# Patient Record
Sex: Female | Born: 1960 | Race: Black or African American | Hispanic: No | Marital: Single | Smoking: Never smoker
Health system: Southern US, Community
[De-identification: ages and names within clinical notes are randomized; demographics above are authoritative.]

## PROBLEM LIST (undated history)

## (undated) HISTORY — PX: EYE SURGERY: SHX253

---

## 2000-05-26 ENCOUNTER — Emergency Department (HOSPITAL_COMMUNITY): Admission: EM | Admit: 2000-05-26 | Discharge: 2000-05-26 | Payer: Self-pay | Admitting: Emergency Medicine

## 2001-01-26 ENCOUNTER — Emergency Department (HOSPITAL_COMMUNITY): Admission: EM | Admit: 2001-01-26 | Discharge: 2001-01-26 | Payer: Self-pay | Admitting: *Deleted

## 2003-05-13 ENCOUNTER — Ambulatory Visit (HOSPITAL_COMMUNITY): Admission: RE | Admit: 2003-05-13 | Discharge: 2003-05-13 | Payer: Self-pay | Admitting: Family Medicine

## 2003-05-23 ENCOUNTER — Other Ambulatory Visit: Admission: RE | Admit: 2003-05-23 | Discharge: 2003-05-23 | Payer: Self-pay | Admitting: Family Medicine

## 2003-08-22 ENCOUNTER — Emergency Department (HOSPITAL_COMMUNITY): Admission: EM | Admit: 2003-08-22 | Discharge: 2003-08-22 | Payer: Self-pay | Admitting: Emergency Medicine

## 2003-11-28 ENCOUNTER — Emergency Department (HOSPITAL_COMMUNITY): Admission: EM | Admit: 2003-11-28 | Discharge: 2003-11-28 | Payer: Self-pay | Admitting: Emergency Medicine

## 2004-06-23 ENCOUNTER — Ambulatory Visit (HOSPITAL_COMMUNITY): Admission: RE | Admit: 2004-06-23 | Discharge: 2004-06-23 | Payer: Self-pay | Admitting: Obstetrics & Gynecology

## 2004-07-13 ENCOUNTER — Ambulatory Visit (HOSPITAL_COMMUNITY): Admission: RE | Admit: 2004-07-13 | Discharge: 2004-07-13 | Payer: Self-pay | Admitting: Obstetrics & Gynecology

## 2006-03-12 ENCOUNTER — Emergency Department (HOSPITAL_COMMUNITY): Admission: EM | Admit: 2006-03-12 | Discharge: 2006-03-12 | Payer: Self-pay | Admitting: Emergency Medicine

## 2010-12-17 ENCOUNTER — Ambulatory Visit (INDEPENDENT_AMBULATORY_CARE_PROVIDER_SITE_OTHER): Payer: Self-pay

## 2010-12-17 ENCOUNTER — Inpatient Hospital Stay (INDEPENDENT_AMBULATORY_CARE_PROVIDER_SITE_OTHER)
Admission: RE | Admit: 2010-12-17 | Discharge: 2010-12-17 | Disposition: A | Discharge status: Home / Self Care | Payer: Self-pay | Source: Ambulatory Visit | Attending: Family Medicine | Admitting: Family Medicine

## 2010-12-17 DIAGNOSIS — J40 Bronchitis, not specified as acute or chronic: Secondary | ICD-10-CM

## 2012-08-15 ENCOUNTER — Emergency Department (HOSPITAL_COMMUNITY): Payer: Self-pay

## 2012-08-15 ENCOUNTER — Other Ambulatory Visit: Payer: Self-pay

## 2012-08-15 ENCOUNTER — Emergency Department (HOSPITAL_COMMUNITY)
Admission: EM | Admit: 2012-08-15 | Discharge: 2012-08-15 | Disposition: A | Discharge status: Home / Self Care | Payer: Self-pay | Attending: Emergency Medicine | Admitting: Emergency Medicine

## 2012-08-15 ENCOUNTER — Encounter (HOSPITAL_COMMUNITY): Payer: Self-pay | Admitting: Emergency Medicine

## 2012-08-15 DIAGNOSIS — J019 Acute sinusitis, unspecified: Secondary | ICD-10-CM | POA: Insufficient documentation

## 2012-08-15 DIAGNOSIS — R059 Cough, unspecified: Secondary | ICD-10-CM | POA: Insufficient documentation

## 2012-08-15 DIAGNOSIS — R51 Headache: Secondary | ICD-10-CM | POA: Insufficient documentation

## 2012-08-15 DIAGNOSIS — R05 Cough: Secondary | ICD-10-CM | POA: Insufficient documentation

## 2012-08-15 LAB — CBC WITH DIFFERENTIAL/PLATELET
Basophils Absolute: 0 10*3/uL (ref 0.0–0.1)
Basophils Relative: 0 % (ref 0–1)
Eosinophils Absolute: 0 10*3/uL (ref 0.0–0.7)
Hemoglobin: 13.1 g/dL (ref 12.0–15.0)
MCH: 28.1 pg (ref 26.0–34.0)
MCHC: 34 g/dL (ref 30.0–36.0)
Monocytes Absolute: 0.5 10*3/uL (ref 0.1–1.0)
Monocytes Relative: 9 % (ref 3–12)
Neutrophils Relative %: 35 % — ABNORMAL LOW (ref 43–77)
RDW: 12.9 % (ref 11.5–15.5)

## 2012-08-15 LAB — BASIC METABOLIC PANEL
BUN: 12 mg/dL (ref 6–23)
Calcium: 9.6 mg/dL (ref 8.4–10.5)
Creatinine, Ser: 0.7 mg/dL (ref 0.50–1.10)
GFR calc non Af Amer: >90 mL/min (ref >90)
Glucose, Bld: 83 mg/dL (ref 70–99)
Potassium: 4.3 mEq/L (ref 3.5–5.1)

## 2012-08-15 LAB — POCT I-STAT TROPONIN I

## 2012-08-15 MED ORDER — AMOXICILLIN 500 MG PO CAPS
500.0000 mg | ORAL_CAPSULE | Freq: Once | ORAL | Status: AC
  Administered 2012-08-15: 500 mg via ORAL
  Filled 2012-08-15: qty 1

## 2012-08-15 MED ORDER — AMOXICILLIN 500 MG PO CAPS: 500.0000 mg | ORAL_CAPSULE | Freq: Three times a day (TID) | ORAL | Status: AC

## 2012-08-15 NOTE — ED Notes (Signed)
Discharge instructions reviewed. Pt verbalized understanding.  

## 2012-08-15 NOTE — ED Notes (Signed)
Patient claimed she had "chest pain"

## 2012-08-15 NOTE — ED Notes (Signed)
Pt states that she has congestion, pain with breathing, and a cough. Pt states it gets worse at night. Pt states she has a headache and low back pain. Pt states she has had her symptoms for a week now and nothing is making her feel better.

## 2012-08-15 NOTE — ED Notes (Signed)
Pt c/o congestion and "chronic bronchitis". Pt states she has had congestion, sore throat, and dizziness x 1wk. Pt states she is having difficulty swallowing and rt and left shoulder and chest pain. Pt also states her blood pressure is "high at night". Pt has not taken any medications for sx.

## 2012-08-16 NOTE — Progress Notes (Signed)
Patient was seen yesterday, 08/15/12, in ED with c/o cough and congestion. Was discharged with prescription for amoxicillin. Returned today requesting assistance with filling medications. CM out to waiting room to speak with patient. Reported not having a job or medication insurance. Patient elligible for [facility] program. Explained MATCH assistance to patient, including $3 co pay per med and enrollment only once a rolling calendar year. Patient voiced understanding. Enrolled in program. Letter given to patient. CM also asked patient for permission to contact Sunny Schlein, [facility] liaisons to assist her with locating a PCP. Verbalized consent and verified demographic information on chart. Felicia to contact patient this afternoon. No further needs identified.

## 2012-08-22 NOTE — ED Provider Notes (Signed)
History     CSN: [id number]  Arrival date & time 08/15/12  1552   None     Chief Complaint  Patient presents with  . URI    Upper resp infection    (Consider location/radiation/quality/duration/timing/severity/associated sxs/prior treatment) Patient is a 52 y.o. female presenting with URI. The history is provided by the patient.  URI Presenting symptoms: congestion, cough, facial pain and rhinorrhea   Presenting symptoms: no fever   Severity:  Moderate Onset quality:  Gradual Duration:  4 days Timing:  Constant Progression:  Unchanged Chronicity:  New Relieved by:  Nothing Worsened by:  Nothing tried   History reviewed. No pertinent past medical history.  Past Surgical History  Procedure Laterality Date  . Eye surgery      No family history on file.  History  Substance Use Topics  . Smoking status: Never Smoker   . Smokeless tobacco: Not on file  . Alcohol Use: No    OB History   Grav Para Term Preterm Abortions TAB SAB Ect Mult Living                  Review of Systems  Constitutional: Negative for fever and chills.  HENT: Positive for congestion and rhinorrhea.   Eyes: Negative.   Respiratory: Positive for cough.   Cardiovascular: Negative.   Gastrointestinal: Negative.   Genitourinary: Negative.   Musculoskeletal: Negative.   Skin: Negative.   Neurological: Negative.   Psychiatric/Behavioral: Negative.     Allergies  Review of patient's allergies indicates no known allergies.  Home Medications   Current Outpatient Rx  Name  Route  Sig  Dispense  Refill  . amoxicillin (AMOXIL) 500 MG capsule   Oral   Take 1 capsule (500 mg total) by mouth 3 (three) times daily.   30 capsule   0     BP 115/62  Pulse 65  Temp(Src) 98.2 F (36.8 C) (Oral)  Resp 20  Ht 5' 2.5" (1.588 m)  Wt 155 lb (70.308 kg)  BMI 27.88 kg/m2  SpO2 99%  Physical Exam  Nursing note and vitals reviewed. Constitutional: She is oriented to person, place, and time.  She appears well-developed and well-nourished. No distress.  HENT:  Head: Normocephalic and atraumatic.  Right Ear: External ear normal.  Mouth/Throat: Oropharynx is clear and moist.  Nasal turbinates swollen and red; clear rhinorrhea.  Eyes: Conjunctivae and EOM are normal. Pupils are equal, round, and reactive to light.  Neck: Normal range of motion. Neck supple.  Cardiovascular: Normal rate, regular rhythm and normal heart sounds.   Pulmonary/Chest: Effort normal and breath sounds normal.  Abdominal: Soft. Bowel sounds are normal.  Musculoskeletal: Normal range of motion.  Neurological: She is oriented to person, place, and time.  No sensory or motor deficit.  Skin: Skin is warm and dry.  Psychiatric: She has a normal mood and affect. Her behavior is normal.    ED Course  Procedures (including critical care time)  Results for orders placed during the hospital encounter of 08/15/12  BASIC METABOLIC PANEL      Result Value Range   Sodium 139  135 - 145 mEq/L   Potassium 4.3  3.5 - 5.1 mEq/L   Chloride 103  96 - 112 mEq/L   CO2 26  19 - 32 mEq/L   Glucose, Bld 83  70 - 99 mg/dL   BUN 12  6 - 23 mg/dL   Creatinine, Ser 4.09  0.50 - 1.10 mg/dL   Calcium  9.6  8.4 - 10.5 mg/dL   GFR calc non Af Amer >90  >90 mL/min   GFR calc Af Amer >90  >90 mL/min  CBC WITH DIFFERENTIAL      Result Value Range   WBC 5.2  4.0 - 10.5 K/uL   RBC 4.66  3.87 - 5.11 MIL/uL   Hemoglobin 13.1  12.0 - 15.0 g/dL   HCT 16.1  09.6 - 04.5 %   MCV 82.6  78.0 - 100.0 fL   MCH 28.1  26.0 - 34.0 pg   MCHC 34.0  30.0 - 36.0 g/dL   RDW 40.9  81.1 - 91.4 %   Platelets 327  150 - 400 K/uL   Neutrophils Relative % 35 (*) 43 - 77 %   Neutro Abs 1.8  1.7 - 7.7 K/uL   Lymphocytes Relative 55 (*) 12 - 46 %   Lymphs Abs 2.8  0.7 - 4.0 K/uL   Monocytes Relative 9  3 - 12 %   Monocytes Absolute 0.5  0.1 - 1.0 K/uL   Eosinophils Relative 1  0 - 5 %   Eosinophils Absolute 0.0  0.0 - 0.7 K/uL   Basophils Relative  0  0 - 1 %   Basophils Absolute 0.0  0.0 - 0.1 K/uL  POCT I-STAT TROPONIN I      Result Value Range   Troponin i, poc 0.00  0.00 - 0.08 ng/mL   Comment 3            Dg Chest 2 View  08/15/2012   *RADIOLOGY REPORT*  Clinical Data: Chest pain, shortness of breath, cough, congestion  CHEST - 2 VIEW  Comparison: 92,012  Findings: Marked dextroconvex thoracolumbar scoliosis. Normal heart size, mediastinal contours and pulmonary vascularity. Lungs clear. No pleural effusion or pneumothorax. Bones appear diffusely demineralized.  IMPRESSION: Significant dextroconvex thoracolumbar scoliosis. No acute abnormalities.   Original Report Authenticated By: Ulyses Southward, M.D.    Rx with amoxicillin for sinusitis.    1. Acute sinusitis          Carleene Cooper III, MD 08/22/12 [phone]

## 2012-10-09 ENCOUNTER — Other Ambulatory Visit (HOSPITAL_COMMUNITY): Payer: Self-pay | Admitting: Internal Medicine

## 2012-10-09 DIAGNOSIS — Z1231 Encounter for screening mammogram for malignant neoplasm of breast: Secondary | ICD-10-CM

## 2012-10-09 DIAGNOSIS — R102 Pelvic and perineal pain: Secondary | ICD-10-CM

## 2012-10-09 DIAGNOSIS — R52 Pain, unspecified: Secondary | ICD-10-CM

## 2012-10-16 ENCOUNTER — Ambulatory Visit (HOSPITAL_COMMUNITY)
Admission: RE | Admit: 2012-10-16 | Discharge: 2012-10-16 | Disposition: A | Discharge status: Home / Self Care | Payer: No Typology Code available for payment source | Source: Ambulatory Visit | Attending: Internal Medicine | Admitting: Internal Medicine

## 2012-10-16 DIAGNOSIS — R52 Pain, unspecified: Secondary | ICD-10-CM

## 2012-10-16 DIAGNOSIS — R102 Pelvic and perineal pain: Secondary | ICD-10-CM

## 2012-10-16 DIAGNOSIS — N949 Unspecified condition associated with female genital organs and menstrual cycle: Secondary | ICD-10-CM | POA: Insufficient documentation

## 2012-10-23 ENCOUNTER — Ambulatory Visit (HOSPITAL_COMMUNITY)
Admission: RE | Admit: 2012-10-23 | Discharge: 2012-10-23 | Disposition: A | Discharge status: Home / Self Care | Payer: No Typology Code available for payment source | Source: Ambulatory Visit | Attending: Internal Medicine | Admitting: Internal Medicine

## 2012-10-23 DIAGNOSIS — Z1231 Encounter for screening mammogram for malignant neoplasm of breast: Secondary | ICD-10-CM | POA: Insufficient documentation

## 2013-09-24 ENCOUNTER — Other Ambulatory Visit (HOSPITAL_COMMUNITY): Payer: Self-pay | Admitting: Internal Medicine

## 2013-09-24 DIAGNOSIS — Z1231 Encounter for screening mammogram for malignant neoplasm of breast: Secondary | ICD-10-CM

## 2013-09-30 ENCOUNTER — Ambulatory Visit (HOSPITAL_COMMUNITY)
Admission: RE | Admit: 2013-09-30 | Discharge: 2013-09-30 | Disposition: A | Discharge status: Home / Self Care | Payer: No Typology Code available for payment source | Source: Ambulatory Visit | Attending: Internal Medicine | Admitting: Internal Medicine

## 2013-09-30 ENCOUNTER — Other Ambulatory Visit (HOSPITAL_COMMUNITY): Payer: Self-pay | Admitting: Internal Medicine

## 2013-09-30 DIAGNOSIS — R0789 Other chest pain: Secondary | ICD-10-CM | POA: Insufficient documentation

## 2013-09-30 DIAGNOSIS — R05 Cough: Secondary | ICD-10-CM

## 2013-09-30 DIAGNOSIS — R059 Cough, unspecified: Secondary | ICD-10-CM | POA: Insufficient documentation

## 2013-09-30 DIAGNOSIS — M4120 Other idiopathic scoliosis, site unspecified: Secondary | ICD-10-CM | POA: Insufficient documentation

## 2013-10-27 ENCOUNTER — Ambulatory Visit (HOSPITAL_COMMUNITY)
Admission: RE | Admit: 2013-10-27 | Discharge: 2013-10-27 | Disposition: A | Discharge status: Home / Self Care | Payer: No Typology Code available for payment source | Source: Ambulatory Visit | Attending: Internal Medicine | Admitting: Internal Medicine

## 2013-10-27 DIAGNOSIS — Z1231 Encounter for screening mammogram for malignant neoplasm of breast: Secondary | ICD-10-CM | POA: Insufficient documentation

## 2013-11-10 ENCOUNTER — Other Ambulatory Visit (HOSPITAL_COMMUNITY): Payer: Self-pay | Admitting: Internal Medicine

## 2013-11-10 ENCOUNTER — Ambulatory Visit (HOSPITAL_COMMUNITY)
Admission: RE | Admit: 2013-11-10 | Discharge: 2013-11-10 | Disposition: A | Discharge status: Home / Self Care | Payer: No Typology Code available for payment source | Source: Ambulatory Visit | Attending: Internal Medicine | Admitting: Internal Medicine

## 2013-11-10 DIAGNOSIS — M25559 Pain in unspecified hip: Secondary | ICD-10-CM | POA: Insufficient documentation

## 2013-11-10 DIAGNOSIS — M1990 Unspecified osteoarthritis, unspecified site: Secondary | ICD-10-CM

## 2013-11-10 DIAGNOSIS — E559 Vitamin D deficiency, unspecified: Secondary | ICD-10-CM

## 2013-11-10 DIAGNOSIS — M25519 Pain in unspecified shoulder: Secondary | ICD-10-CM | POA: Insufficient documentation

## 2014-09-17 IMAGING — US US PELVIS COMPLETE
1 series · 14 of 25 positions shown · non-contrast
Comparison: 06/23/2004

CLINICAL DATA: Bilateral pelvic pain.

TRANSABDOMINAL AND TRANSVAGINAL ULTRASOUND OF PELVIS
TECHNIQUE: Both transabdominal and transvaginal ultrasound
examinations of the pelvis were performed including evaluation of
the uterus, ovaries, adnexal regions, and pelvic cul-de-sac.

[Series 1: us pelvis complete · 0.28mm/px · 47 acquisitions, 14 frames shown]
[im 1/47]
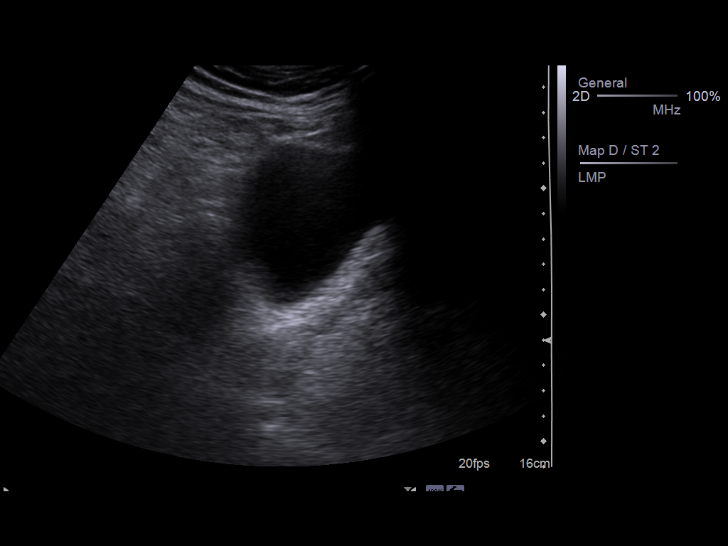
[im 4/47]
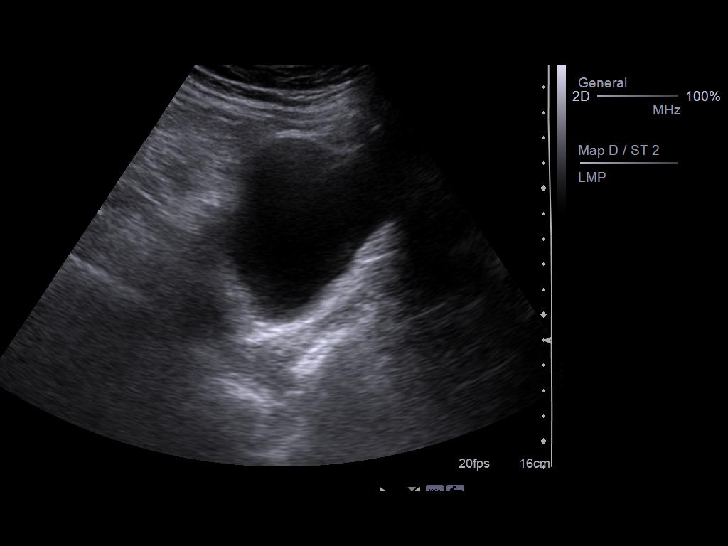
[im 8/47]
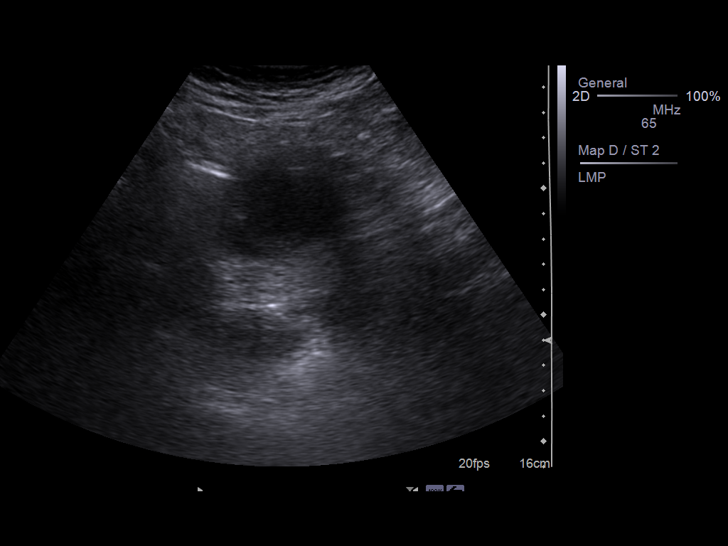
[im 12/47]
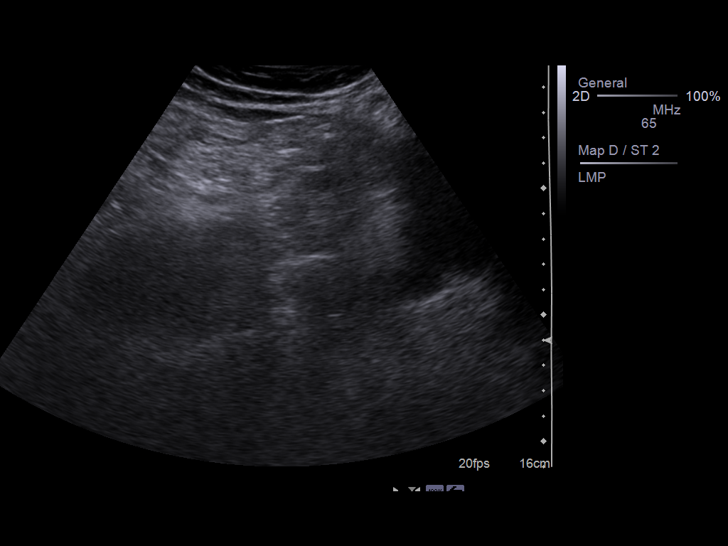
[im 16/47]
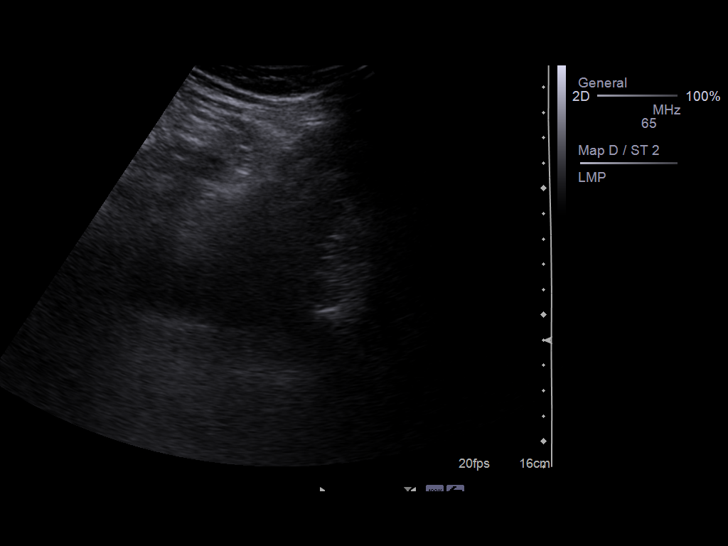
[im 18/47]
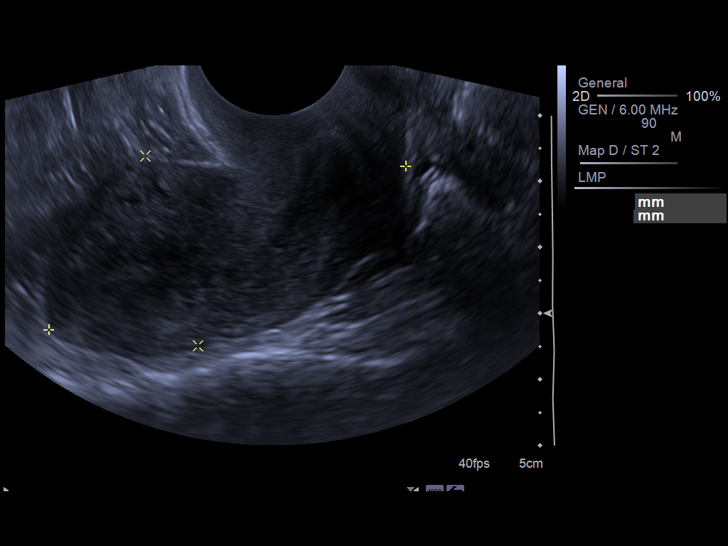
[im 22/47]
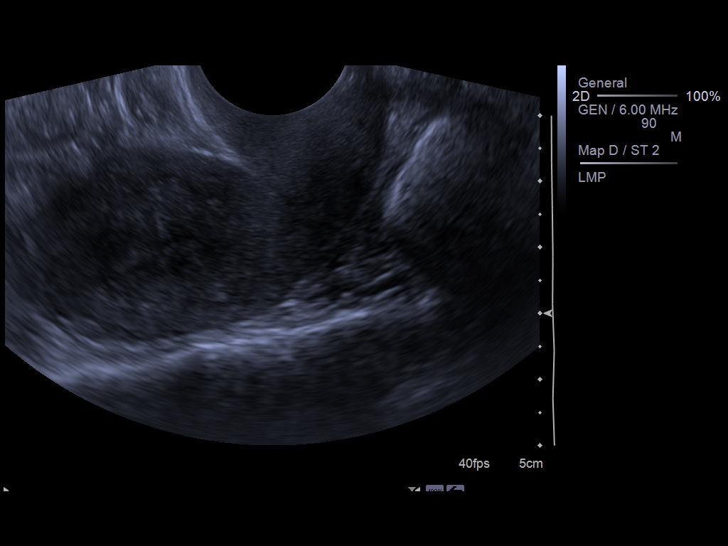
[im 25/47]
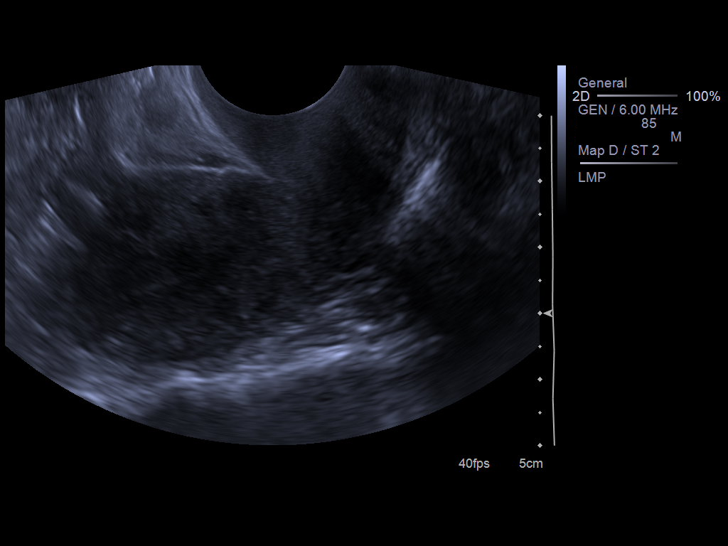
[im 29/47]
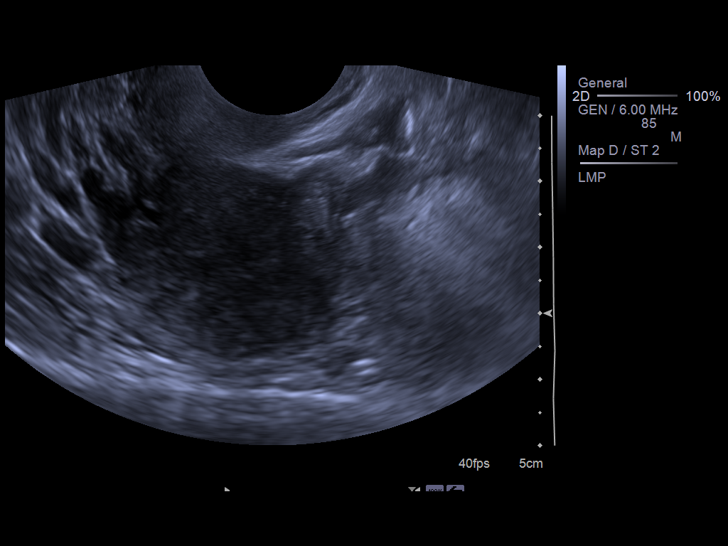
[im 31/47]
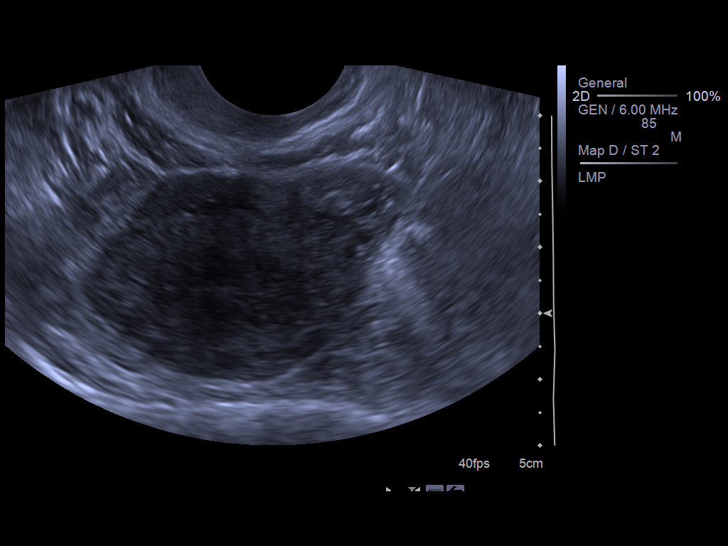
[im 35/47]
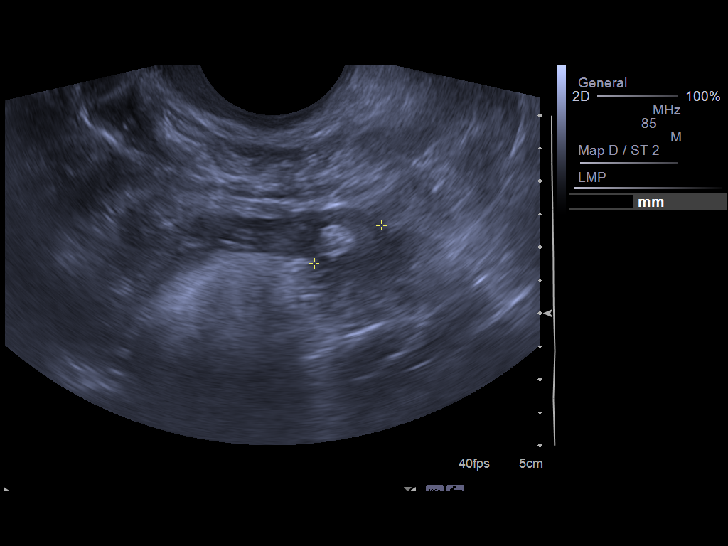
[im 39/47]
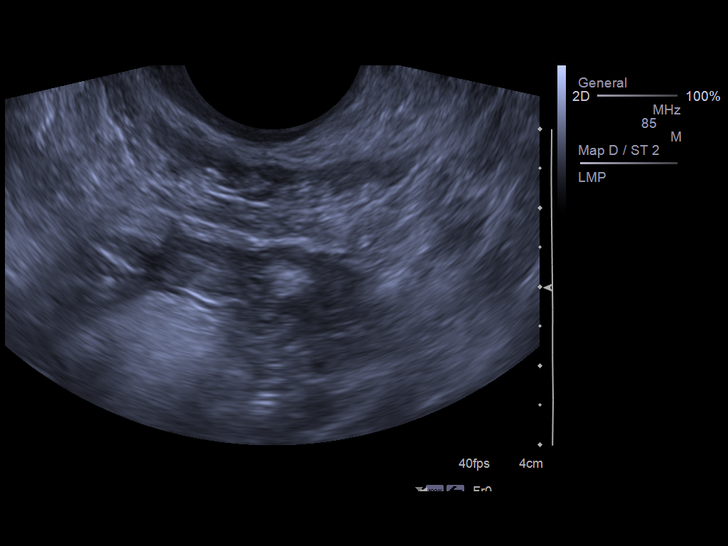
[im 43/47]
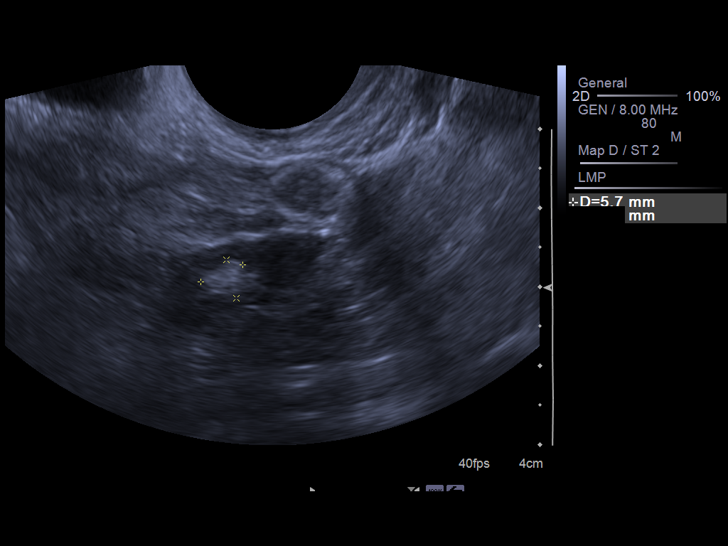
[im 47/47]
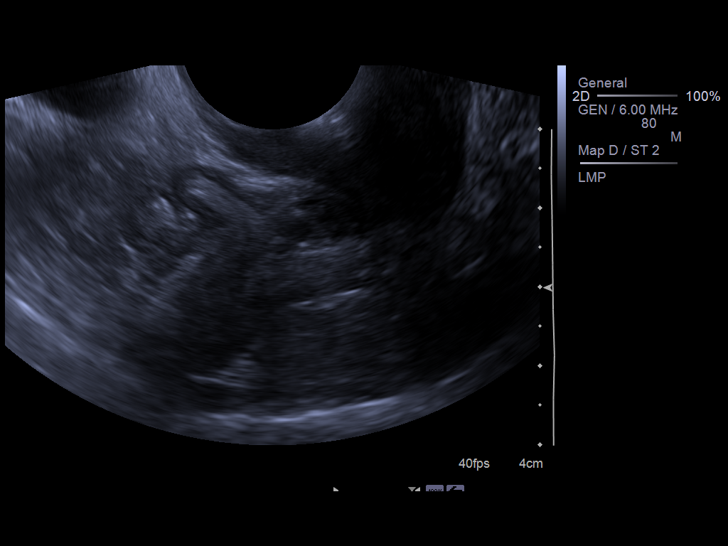

[14 of 25 positions shown; findings below may reference images not displayed]

FINDINGS: Uterus: 5.9 x 3.0 x 3.8 cm.  Normal echotexture.  No focal
abnormality.

Endometrium: Normal appearance and thickness, 4 mm.

Right Ovary: Not visualized.  No adnexal mass.

Left Ovary: 2.1 x 1.8 x 1.2 cm.  6 mm hyperechoic focus within the
left ovary.  This was seen in 8002 and is stable.  Stability is
compatible with a benign process.

Other Findings:  No free fluid.
IMPRESSION: No acute findings.

## 2015-04-07 ENCOUNTER — Other Ambulatory Visit: Payer: Self-pay | Admitting: Internal Medicine

## 2015-04-07 DIAGNOSIS — Z1231 Encounter for screening mammogram for malignant neoplasm of breast: Secondary | ICD-10-CM

## 2015-04-14 ENCOUNTER — Ambulatory Visit
Admission: RE | Admit: 2015-04-14 | Discharge: 2015-04-14 | Disposition: A | Discharge status: Home / Self Care | Payer: No Typology Code available for payment source | Source: Ambulatory Visit | Attending: Internal Medicine | Admitting: Internal Medicine

## 2015-04-14 DIAGNOSIS — Z1231 Encounter for screening mammogram for malignant neoplasm of breast: Secondary | ICD-10-CM

## 2015-10-12 IMAGING — CR DG SHOULDER 2+V*L*
4 series · 4 of 4 positions shown · non-contrast
Comparison: None.

CLINICAL DATA: Posterior shoulder pain, history of osteoarthritis

EXAM:
LEFT SHOULDER - 2+ VIEW

[view not recorded (1 of 4)]
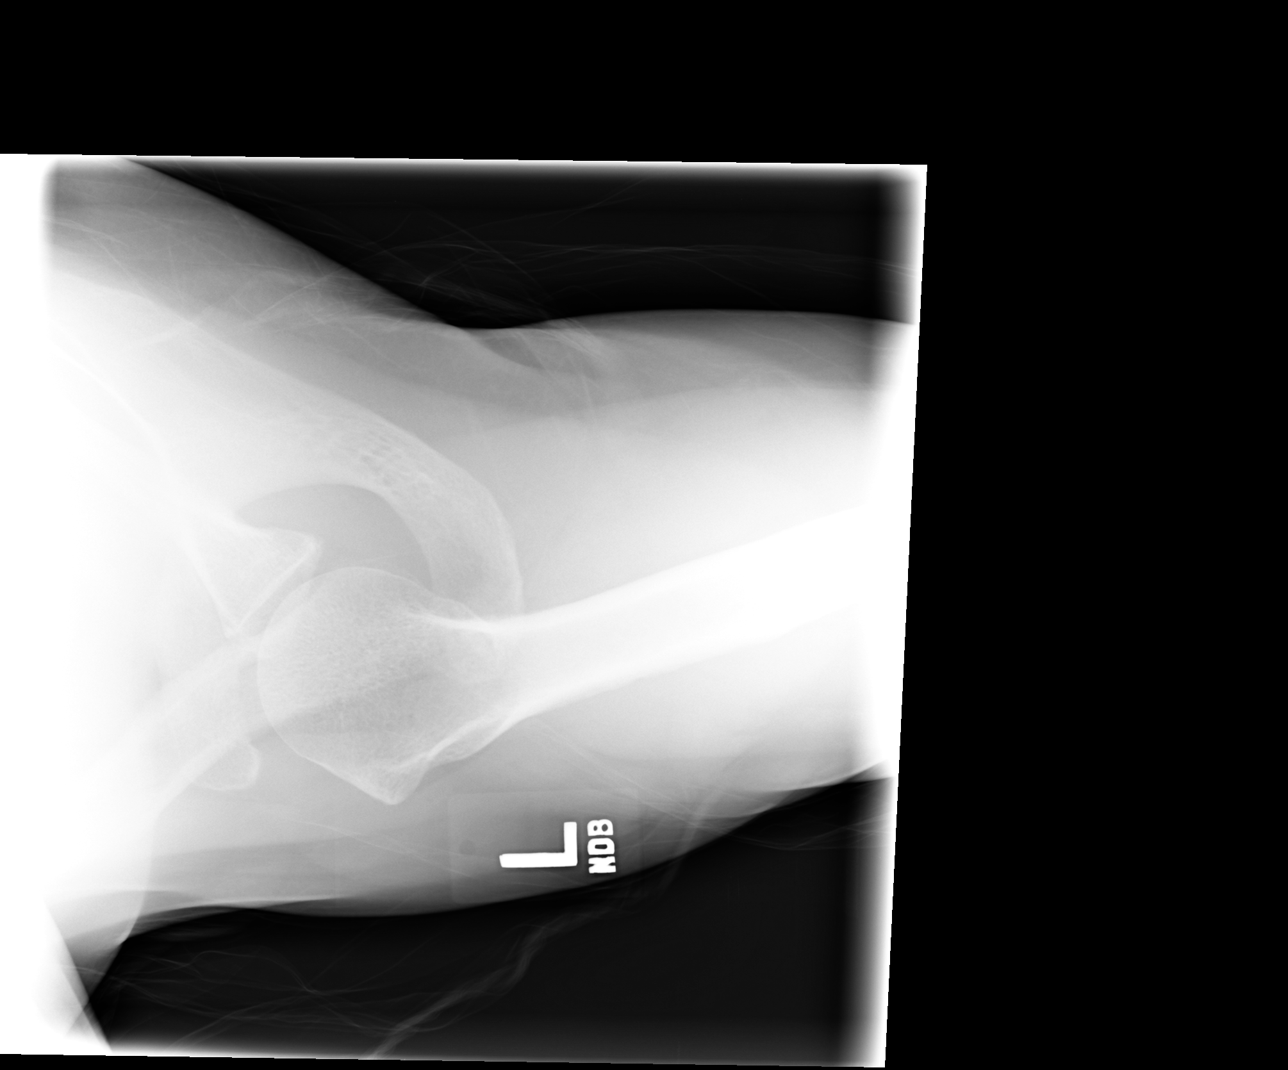

[view not recorded (2 of 4)]
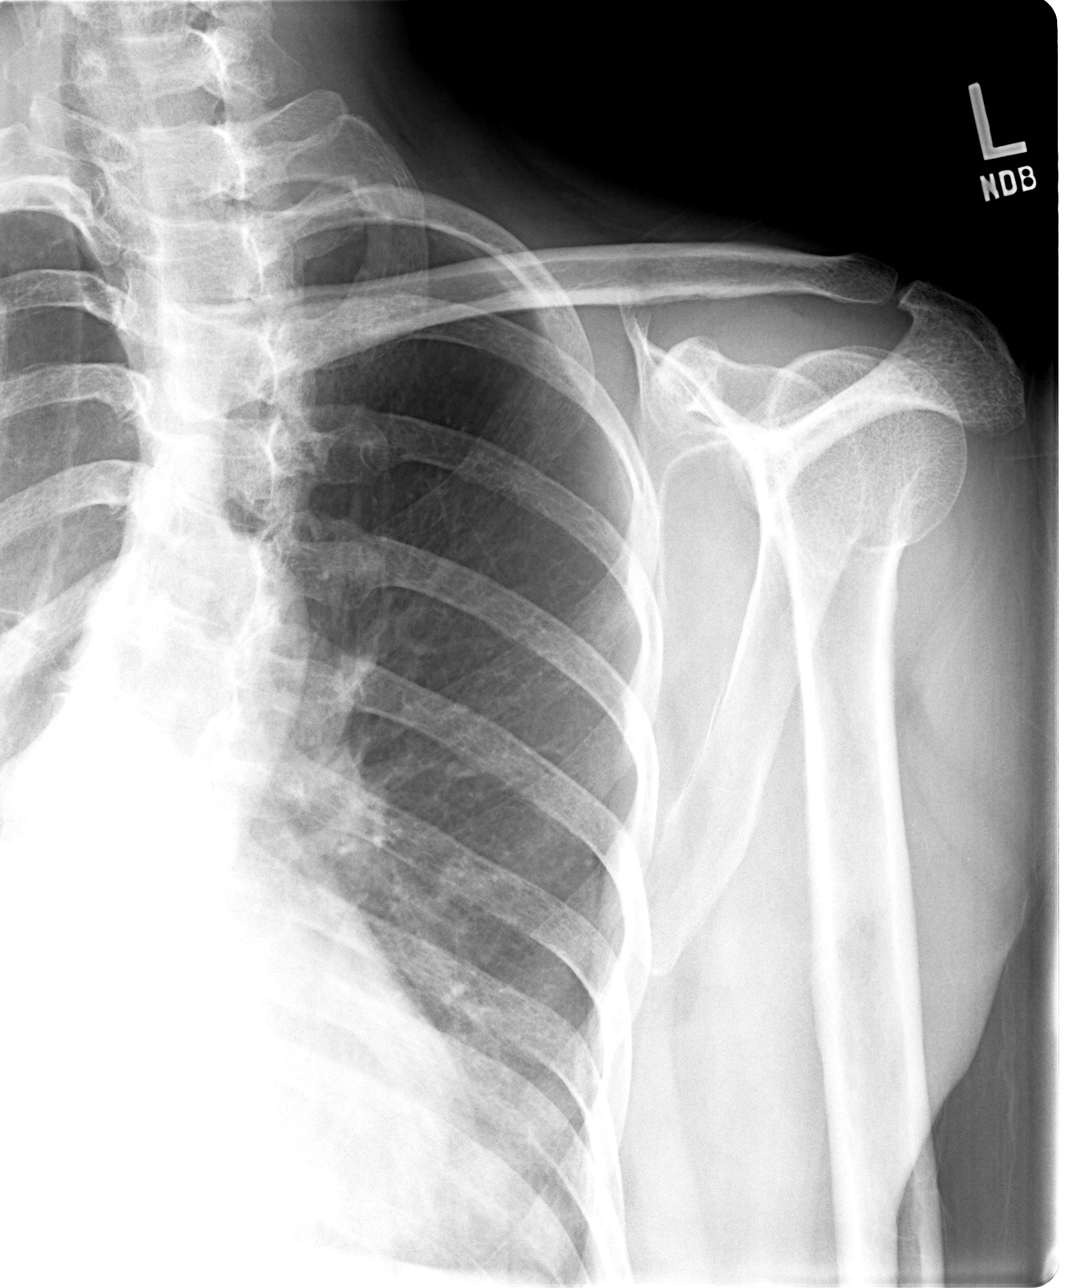

[view not recorded (3 of 4)]
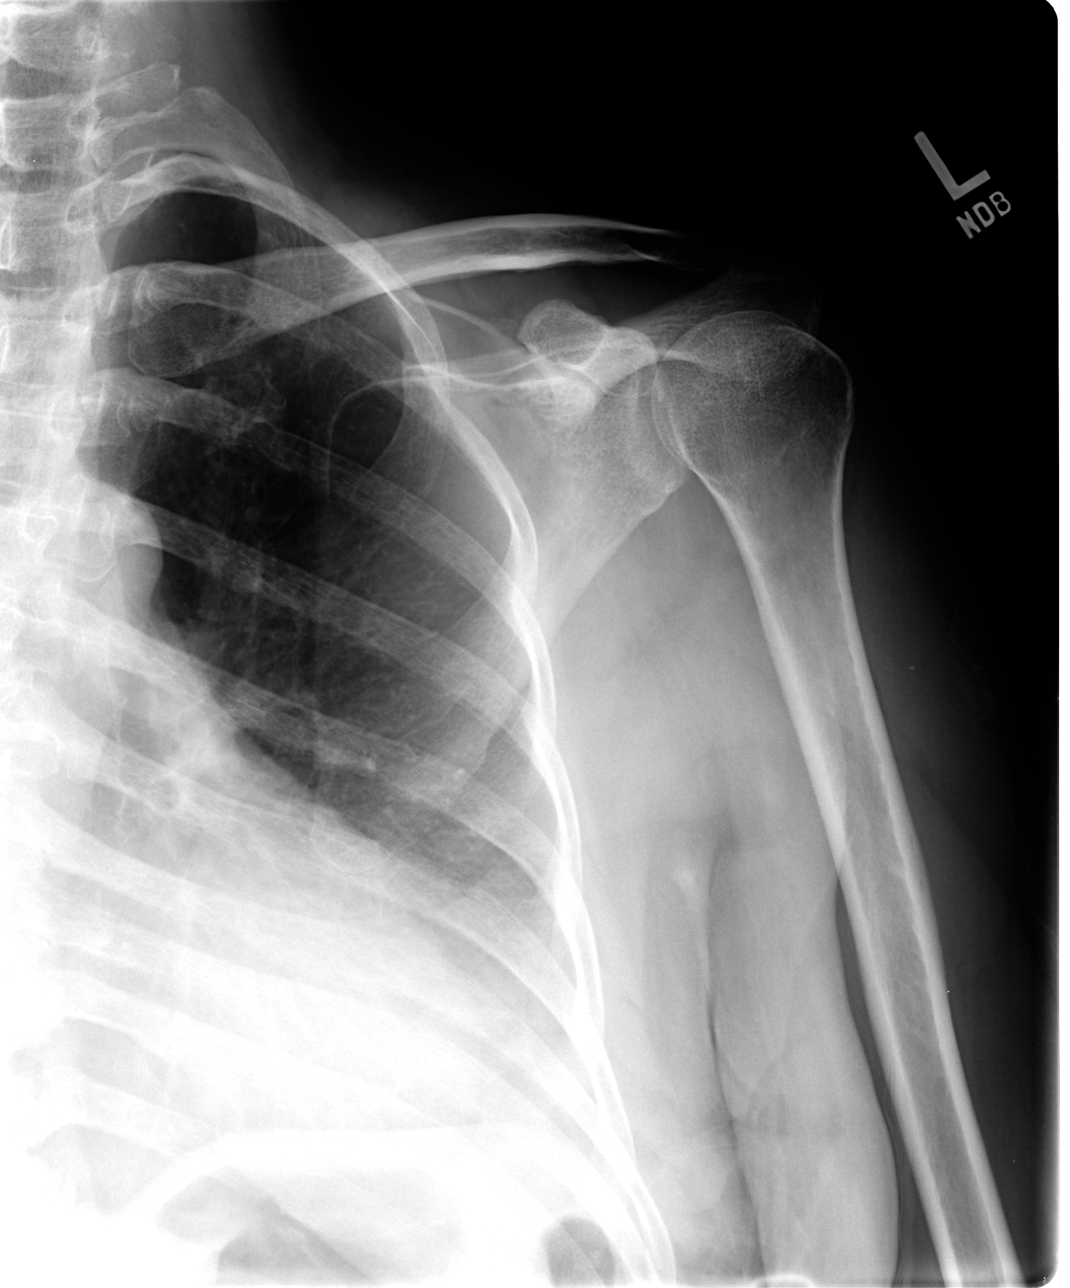

[view not recorded (4 of 4)]
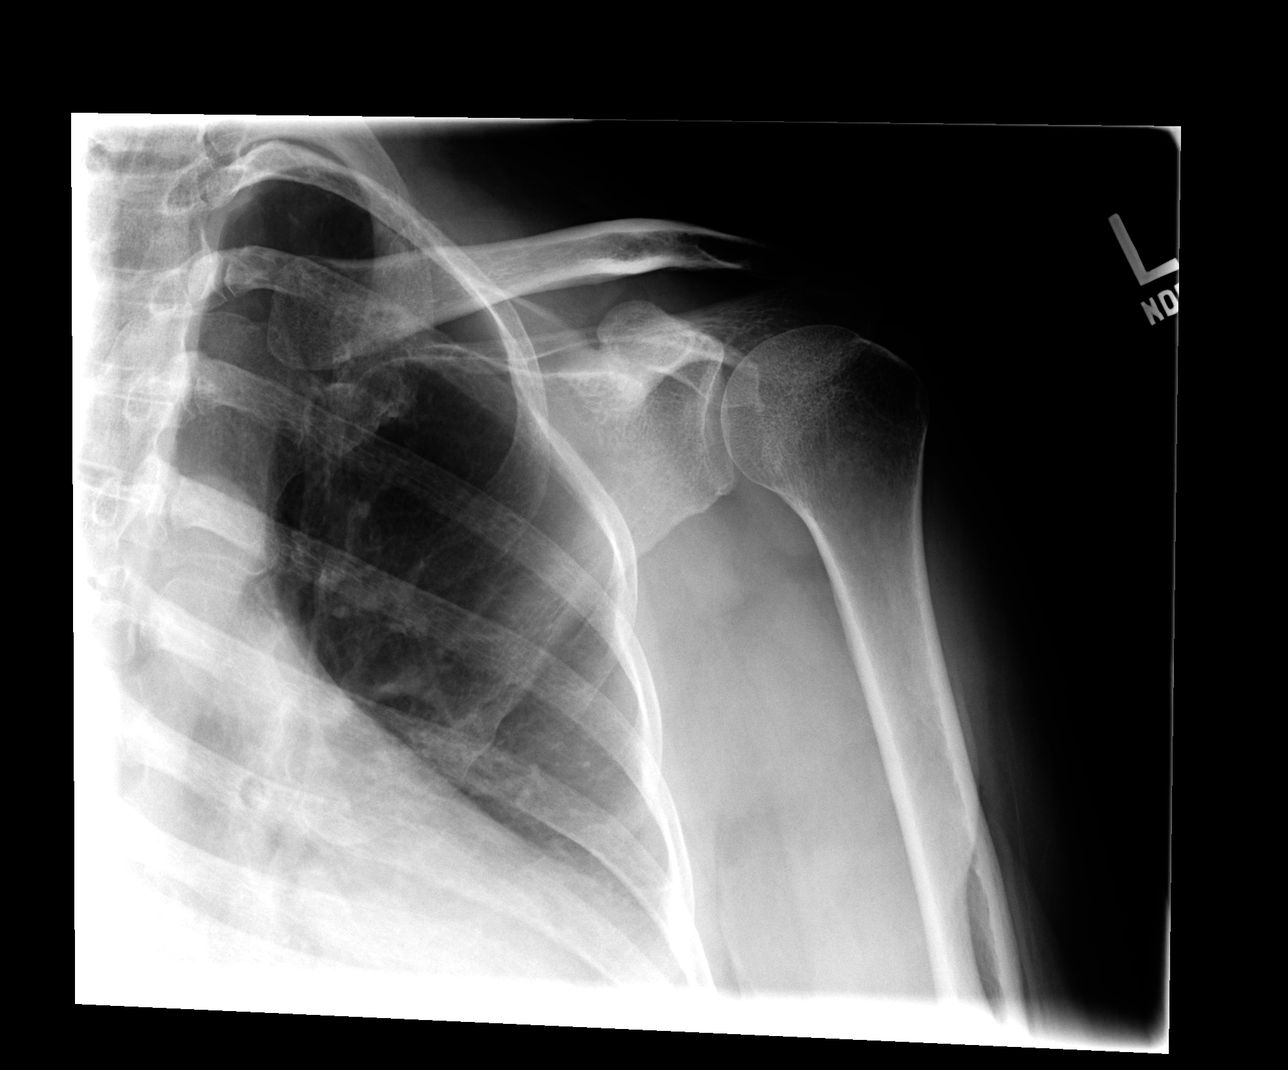

[4 of 4 positions shown; findings below may reference images not displayed]

FINDINGS: No fracture or dislocation is seen.

The joint spaces are preserved.

The visualized soft tissues are unremarkable.

Visualized left lung is clear.
IMPRESSION: No acute osseus abnormality is seen.

## 2020-12-22 ENCOUNTER — Encounter: Payer: No Typology Code available for payment source | Admitting: Obstetrics & Gynecology

## 2022-01-03 ENCOUNTER — Encounter (HOSPITAL_COMMUNITY): Payer: Self-pay | Admitting: *Deleted

## 2022-01-03 ENCOUNTER — Ambulatory Visit (HOSPITAL_COMMUNITY)
Admission: EM | Admit: 2022-01-03 | Discharge: 2022-01-03 | Disposition: A | Discharge status: Home / Self Care | Payer: No Typology Code available for payment source | Attending: Physician Assistant | Admitting: Physician Assistant

## 2022-01-03 DIAGNOSIS — M545 Low back pain, unspecified: Secondary | ICD-10-CM

## 2022-01-03 DIAGNOSIS — M62830 Muscle spasm of back: Secondary | ICD-10-CM

## 2022-01-03 MED ORDER — TIZANIDINE HCL 4 MG PO TABS: 4.0000 mg | ORAL_TABLET | Freq: Four times a day (QID) | ORAL | 0 refills | Status: AC | PRN

## 2022-01-03 MED ORDER — IBUPROFEN 600 MG PO TABS: 600.0000 mg | ORAL_TABLET | Freq: Four times a day (QID) | ORAL | 0 refills | Status: AC | PRN

## 2022-01-03 NOTE — Discharge Instructions (Addendum)
Advised to use ice therapy, 10 minutes on 20 minutes off, 3-4 times throughout the evening to help reduce pain and spasm. Advise take ibuprofen 600 mg every 6-8 hours with food to help reduce pain and inflammation. Advised to use the Zanaflex to decrease muscle spasm and irritability every 6-8 hours. Advised to watch what you are doing as far as turning, lifting objects as it usually takes about 2 weeks or so for the back pain to resolve and heal. Advised to follow-up PCP or return to urgent care if symptoms fail to improve.

## 2022-01-03 NOTE — ED Provider Notes (Signed)
[place]    CSN: [id number] Arrival date & time: 01/03/22  1226      History   Chief Complaint Chief Complaint  Patient presents with   Back Pain    HPI Jocelyn Lee is a 61 y.o. female.   61 year old female presents with lower back pain.  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.  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  She relates that her pain is mainly on the lower right side and it is localized.  She relates that the pain does not travel, she has not taken any medicines to help relieve her discomfort.  She relates she has been using some heat intermittently but she relates this only gives her a little bit of relief.  She denies any numbness, tingling, or weakness of the lower extremities.   Back Pain   History reviewed. No pertinent past medical history.  There are no problems to display for this patient.   Past Surgical History:  Procedure Laterality Date   EYE SURGERY      OB History   No obstetric history on file.      Home Medications    Prior to Admission medications   Medication Sig Start Date End Date Taking? Authorizing Provider  ibuprofen (ADVIL) 600 MG tablet Take 1 tablet (600 mg total) by mouth every 6 (six) hours as needed. 01/03/22  Yes Nyoka Lint, PA-C  tiZANidine (ZANAFLEX) 4 MG tablet Take 1 tablet (4 mg total) by mouth every 6 (six) hours as needed for muscle spasms. 01/03/22  Yes Nyoka Lint, PA-C  amoxicillin (AMOXIL) 500 MG capsule Take 1 capsule (500 mg total) by mouth 3 (three) times daily. 08/15/12   Mylinda Latina, MD    Family History History reviewed. No pertinent family history.  Social History Social History    Tobacco Use   Smoking status: Never  Substance Use Topics   Alcohol use: No   Drug use: No     Allergies   Patient has no known allergies.   Review of Systems Review of Systems  Musculoskeletal:  Positive for back pain (lower back).     Physical Exam Triage Vital Signs ED Triage Vitals  Enc Vitals Group     BP 01/03/22 1318 (!) 166/85     Pulse Rate 01/03/22 1318 88     Resp 01/03/22 1318 18     Temp 01/03/22 1318 98.1 F (36.7 C)     Temp Source 01/03/22 1318 Oral     SpO2 01/03/22 1318 99 %     Weight --      Height --      Head Circumference --      Peak Flow --      Pain Score 01/03/22 1316 6     Pain Loc --      Pain Edu? --      Excl. in [place]? --    No data found.  Updated Vital Signs BP (!) 166/85 (BP Location: Left Arm)   Pulse 88   Temp 98.1 F (36.7 C) (Oral)   Resp 18   SpO2 99%   Visual Acuity Right Eye Distance:   Left  Eye Distance:   Bilateral Distance:    Right Eye Near:   Left Eye Near:    Bilateral Near:     Physical Exam Constitutional:      Appearance: Normal appearance.  Musculoskeletal:       Back:     Comments: Back: Pain is palpated along the L5-S1 lateral paraspinous area on the right side, no unusual redness or swelling.  Range of motion is limited with pain on bending and turning.  Negative straight leg raise bilaterally, strength is intact bilaterally, DTRs are 2+ and symmetrical bilaterally.  Neurological:     Mental Status: She is alert.      UC Treatments / Results  Labs (all labs ordered are listed, but only abnormal results are displayed) Labs Reviewed - No data to display  EKG   Radiology No results found.  Procedures Procedures (including critical care time)  Medications Ordered in UC Medications - No data to display  Initial Impression / Assessment and Plan / UC Course  I have reviewed the triage vital signs and the nursing notes.  Pertinent labs & imaging results that were available during  my care of the patient were reviewed by me and considered in my medical decision making (see chart for details).       Plan: 1.  Acute lower back pain will be treated with ibuprofen 600 mg every 8 hours with food to help reduce pain and swelling. 2.  Acute lower back pain will be treated with Zanaflex every 6 hours to help relieve muscle spasm and irritability. 3.  Lower back pain will be treated with ice therapy, 10 minutes on 20 minutes off, to help reduce pain and muscle spasm. 4.  Patient advised to modify her work and to watch what she does as far as lifting, turning, bending on a repetitive basis. 5.  Patient advised to follow-up PCP or return to urgent care if symptoms fail to improve. Final Clinical Impressions(s) / UC Diagnoses   Final diagnoses:  Acute right-sided low back pain without sciatica  Muscle spasm of back     Discharge Instructions      Advised to use ice therapy, 10 minutes on 20 minutes off, 3-4 times throughout the evening to help reduce pain and spasm. Advise take ibuprofen 600 mg every 6-8 hours with food to help reduce pain and inflammation. Advised to use the Zanaflex to decrease muscle spasm and irritability every 6-8 hours. Advised to watch what you are doing as far as turning, lifting objects as it usually takes about 2 weeks or so for the back pain to resolve and heal. Advised to follow-up PCP or return to urgent care if symptoms fail to improve.     ED Prescriptions     Medication Sig Dispense Auth. Provider   ibuprofen (ADVIL) 600 MG tablet Take 1 tablet (600 mg total) by mouth every 6 (six) hours as needed. 30 tablet Ellsworth Lennox, PA-C   tiZANidine (ZANAFLEX) 4 MG tablet Take 1 tablet (4 mg total) by mouth every 6 (six) hours as needed for muscle spasms. 30 tablet Ellsworth Lennox, PA-C      PDMP not reviewed this encounter.   Ellsworth Lennox, PA-C 01/03/22 1337

## 2022-01-03 NOTE — ED Triage Notes (Addendum)
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 She states this isnt a workers comp case but she is going to call someone if she needs to file a claim.
